# Patient Record
Sex: Male | Born: 1980 | Hispanic: No | Marital: Married | Smoking: Never smoker
Health system: Southern US, Community
[De-identification: ages and names within clinical notes are randomized; demographics above are authoritative.]

---

## 2004-11-25 ENCOUNTER — Encounter: Admission: RE | Admit: 2004-11-25 | Discharge: 2004-11-25 | Payer: Self-pay | Admitting: Specialist

## 2009-04-02 ENCOUNTER — Emergency Department (HOSPITAL_COMMUNITY): Admission: EM | Admit: 2009-04-02 | Discharge: 2009-04-02 | Payer: Self-pay | Admitting: Family Medicine

## 2009-05-17 ENCOUNTER — Encounter: Admission: RE | Admit: 2009-05-17 | Discharge: 2009-05-17 | Payer: Self-pay | Admitting: Neurology

## 2012-05-13 ENCOUNTER — Ambulatory Visit: Payer: BC Managed Care – PPO | Admitting: Family Medicine

## 2012-05-13 ENCOUNTER — Ambulatory Visit: Payer: BC Managed Care – PPO

## 2012-05-13 VITALS — BP 120/99 | HR 90 | Temp 98.4°F | Resp 18 | Ht 68.0 in | Wt 209.0 lb

## 2012-05-13 DIAGNOSIS — Z111 Encounter for screening for respiratory tuberculosis: Secondary | ICD-10-CM

## 2012-05-13 DIAGNOSIS — R590 Localized enlarged lymph nodes: Secondary | ICD-10-CM

## 2012-05-13 DIAGNOSIS — R599 Enlarged lymph nodes, unspecified: Secondary | ICD-10-CM

## 2012-05-13 DIAGNOSIS — J029 Acute pharyngitis, unspecified: Secondary | ICD-10-CM

## 2012-05-13 LAB — POCT CBC
Granulocyte percent: 39.8 %G (ref 37–80)
Hemoglobin: 13.5 g/dL — AB (ref 14.1–18.1)
Lymph, poc: 1.8 (ref 0.6–3.4)
MID (cbc): 0.3 (ref 0–0.9)
POC Granulocyte: 1.4 — AB (ref 2–6.9)
POC MID %: 9.2 %M (ref 0–12)

## 2012-05-13 LAB — POCT SEDIMENTATION RATE: POCT SED RATE: 26 mm/hr — AB (ref 0–22)

## 2012-05-13 NOTE — Progress Notes (Signed)
Subjective:    Patient ID: Albert Silva, male    DOB: August 01, 1981, 31 y.o.   MRN: [id number]  HPI   Pain in bilateral neck x 2 wks - was in [place] seeing family when it started - was treated w/ penicillin and lozenges - was about 2 wks ago. Then when he got back tot [place] tried mucinex.  Feels swollen knots in neck that alternate.  Has a mild pharyngitis.  No known sick contacts. 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   Review of Systems  Constitutional: Positive for chills and diaphoresis. Negative for fever, activity change and fatigue.  HENT: Positive for congestion, sore throat, neck pain, dental problem and postnasal drip. Negative for trouble swallowing, neck stiffness, voice change and sinus pressure.   Respiratory: Negative for cough, chest tightness and shortness of breath.   Cardiovascular: Negative for chest pain.  Skin: Negative for color change and rash.  Hematological: Positive for adenopathy. Does not bruise/bleed easily.  Psychiatric/Behavioral: Negative for disturbed wake/sleep cycle.       Objective:   Physical Exam  Vitals reviewed. Constitutional: He is oriented to person, place, and time. He appears well-developed and well-nourished. No distress.  HENT:  Head: Normocephalic and atraumatic.  Right Ear: External ear normal.  Left Ear: External ear normal.  Nose: Nose normal.  Mouth/Throat: Oropharynx is clear and moist. No oropharyngeal exudate.  Eyes: Conjunctivae normal are normal. No scleral icterus.  Neck: Normal range of motion. Neck supple. No thyromegaly present.  Cardiovascular: Normal rate, regular rhythm and normal heart sounds.   Pulmonary/Chest: Effort normal and breath sounds normal.  Musculoskeletal: He exhibits no edema.  Lymphadenopathy:       Head (right side): Submandibular adenopathy present. No submental, no preauricular, no posterior auricular and no occipital adenopathy present.       Head (left side): Submandibular  adenopathy present. No submental, no preauricular, no posterior auricular and no occipital adenopathy present.    He has cervical adenopathy.       Right cervical: Superficial cervical adenopathy present. No posterior cervical adenopathy present.      Left cervical: Superficial cervical adenopathy present. No posterior cervical adenopathy present.       Right: No supraclavicular adenopathy present.       Left: No supraclavicular adenopathy present.  Neurological: He is alert and oriented to person, place, and time.  Skin: Skin is warm and dry. No rash noted. He is not diaphoretic.  Psychiatric: He has a normal mood and affect. His behavior is normal.     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 Cervical lymphadenopathy - HIV, EBV, cbc, esr P.  If negative, consider toxo, cmv. CXR to screen for TB w/ h/o +ppd. Continue watchful waitin.

## 2012-05-14 LAB — EPSTEIN-BARR VIRUS VCA ANTIBODY PANEL
EBV NA IgG: >600 U/mL — ABNORMAL HIGH (ref <18.0)
EBV VCA IgM: <10 U/mL (ref <36.0)

## 2014-06-26 ENCOUNTER — Ambulatory Visit (INDEPENDENT_AMBULATORY_CARE_PROVIDER_SITE_OTHER): Payer: BC Managed Care – PPO | Admitting: Emergency Medicine

## 2014-06-26 VITALS — BP 118/84 | HR 65 | Temp 98.1°F | Resp 18 | Ht 68.0 in | Wt 194.4 lb

## 2014-06-26 DIAGNOSIS — N489 Disorder of penis, unspecified: Secondary | ICD-10-CM

## 2014-06-26 DIAGNOSIS — A630 Anogenital (venereal) warts: Secondary | ICD-10-CM

## 2014-06-26 DIAGNOSIS — N4889 Other specified disorders of penis: Secondary | ICD-10-CM

## 2014-06-26 MED ORDER — PODOFILOX 0.5 % EX GEL: Freq: Two times a day (BID) | CUTANEOUS | Status: DC

## 2014-06-26 NOTE — Progress Notes (Signed)
    MRN: [id number] DOB: 01/31/1981  Subjective:   Albert Silva is a 33 y.o. male presenting for 6 week history of lesion on right side of penis. Patient noticed it first in the shower, was itching, not bleeding, no vesicles, he tried cleaning the lesion but did not resolve afterward. States that it scabs over, peels the skin off, but just won't go away. He continues to try and clean the lesion thoroughly some times to the point that it bleeds a little. Patient has also tried to use hydrocortisone cream OTC, other OTC creams he cannot recall with no relief. Lesion is not painful, denies penile discharge, dysuria, fevers, n/v, diarrhea, abdominal pain, rashes on soles of hands and feet, mouth sores, lesions elsewhere, weight loss. Patient is married since 2005, monogamous relatioship, fathered 2 children with his wife, sexually active with wife, no barrier contraception. Denies that wife has any symptoms.   Denies smoking cigarettes, drug use or drinking alcohol. Denies any other aggravating or relieving factors, no other questions or concerns.   Prior to Admission medications   Medication Sig Start Date End Date Taking? Authorizing Provider  guaiFENesin (MUCINEX) 600 MG 12 hr tablet Take 1,200 mg by mouth 2 (two) times daily.    Historical Provider, MD    No Known Allergies   History reviewed. No pertinent past medical history.   History reviewed. No pertinent past surgical history.   ROS As in subjective.   Objective:   PHYSICAL EXAM BP 118/84 mmHg  Pulse 65  Temp(Src) 98.1 F (36.7 C) (Oral)  Resp 18  Ht 5\' 8"  (1.727 m)  Wt 194 lb 6.4 oz (88.179 kg)  BMI 29.57 kg/m2  SpO2 100%  Physical Exam  Constitutional: He is oriented to person, place, and time and well-developed, well-nourished, and in no distress. No distress.  HENT:  Mouth/Throat: Oropharynx is clear and moist. No oropharyngeal exudate.  Eyes: Conjunctivae are normal. Pupils are equal, round, and reactive to  light. Right eye exhibits no discharge. Left eye exhibits no discharge. No scleral icterus.  Cardiovascular: Normal rate, regular rhythm, normal heart sounds and intact distal pulses.  Exam reveals no gallop and no friction rub.   No murmur heard. Pulmonary/Chest: Effort normal and breath sounds normal. No respiratory distress. He has no wheezes.  Abdominal: Soft. Bowel sounds are normal. He exhibits no distension and no mass. There is no tenderness.  Genitourinary: Testes/scrotum normal. He exhibits no abnormal testicular mass, no testicular tenderness, no abnormal scrotal mass and no scrotal tenderness. Penis exhibits lesions (multiple small dry, scaly, crusty lesions on mid-lateral shaft extending toward base). Penis exhibits no edema. No discharge found.  Neurological: He is alert and oriented to person, place, and time.  Skin: Skin is warm and dry. He is not diaphoretic. No erythema.  Psychiatric: Affect normal.    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 Lesion of penis Rx Podofilox, labs pending, return to clinic if symptoms worsen, fail to resolve or as needed - HIV antibody - RPR - HSV(herpes simplex vrs) 1+2 ab-IgG   Wallis BambergMario Denzil Bristol, PA-C [facility] [phone] 06/26/2014 12:02 PM

## 2014-06-26 NOTE — Patient Instructions (Addendum)
Apply Podofilox cream twice a day for 3 days, then stop for four days. This is one cycle. Complete four cycles for a total of 4 weeks treatment. If symptoms have not resolved, let me know so we can see for reevaluation or try a different treatment.   Human Papillomavirus HPV stands for human papillomavirus. There are many different types of HPV. People of all ages and races can get an HPV infection. In females, some types of HPV can cause warts on the sex organs or anus. Some females never see any warts on the outside, yet still have the infection inside. The doctor will need to check the sex organs and do a Pap test to see there is an HPV infection. The only sign of infection may be a Pap test that is abnormal. A male who has HPV has a higher chance of getting cancer of the sex organs or anus. It is very rare, but some females can give their babies the HPV infection when the baby is being born. Some of these babies can grow warts on the voice box (vocal cords). Males with HPV have a higher chance of getting cancer of the penis or anus. A male may have HPV but not see any warts on his penis or anus. There is no test to find HPV in males, so even if warts are not seen, there may still be an infection. HOME CARE   Take medicines as told by your doctor.  Get needed Pap tests.  Keep follow-up exams.  Do not touch or scratch the warts.  Do not treat warts with medicines used for treating hand warts.  Tell your sex partner about your infection because he or she may also need treatment.  Do not have sex while you are getting treatment.  After treatment, use condoms during sex.  Use over-the-counter creams for itching as told by your doctor.  Use over-the-counter or prescription medicines for pain, discomfort or fever as told by your doctor.  Do not douche or use tampons during treatment of HPV. GET HELP RIGHT AWAY IF:  You have a temperature by mouth above 102 F (38.9 C), not controlled  by medicine. MAKE SURE YOU:   Understand these instructions.  Will watch your condition.  Will get help right away if you are not doing well or get worse. Document Released: 07/13/2008 Document Revised: 10/23/2011 Document Reviewed: 11/05/2013 [facility] Patient Information 2015 [place], [place]. This information is not intended to replace advice given to you by your health care provider. Make sure you discuss any questions you have with your health care provider.

## 2014-06-27 LAB — HIV ANTIBODY (ROUTINE TESTING W REFLEX): HIV 1&2 Ab, 4th Generation: NONREACTIVE

## 2014-06-27 LAB — RPR

## 2014-06-29 LAB — HSV(HERPES SIMPLEX VRS) I + II AB-IGG
HSV 1 GLYCOPROTEIN G AB, IGG: 8.21 IV — AB
HSV 2 Glycoprotein G Ab, IgG: 6.13 IV — ABNORMAL HIGH

## 2014-06-30 ENCOUNTER — Telehealth: Payer: Self-pay | Admitting: *Deleted

## 2014-06-30 NOTE — Telephone Encounter (Signed)
Pt called and I called the pharmacy they do not make condylox in the gel and that they only make it in a solution.  Can we change from the gel to the solution?  Pt number is [phone]

## 2014-06-30 NOTE — Telephone Encounter (Signed)
Pt would like to know lab results.  Please review.

## 2014-06-30 NOTE — Telephone Encounter (Signed)
Left message for patient requesting a call back to discuss plan and follow up.  Do not switch to solution. I will discuss two alternative treatments with the patient.   Wallis BambergMario Zebulen Simonis, PA-C [facility] [phone] 06/25/2014 9:53 AM

## 2014-07-01 ENCOUNTER — Other Ambulatory Visit: Payer: Self-pay | Admitting: Urgent Care

## 2014-07-01 ENCOUNTER — Telehealth: Payer: Self-pay

## 2014-07-01 DIAGNOSIS — L739 Follicular disorder, unspecified: Secondary | ICD-10-CM

## 2014-07-01 MED ORDER — MUPIROCIN 2 % EX OINT: 1.0000 "application " | TOPICAL_OINTMENT | Freq: Three times a day (TID) | CUTANEOUS | Status: AC

## 2014-07-01 MED ORDER — HYDROXYZINE HCL 25 MG PO TABS: 12.5000 mg | ORAL_TABLET | Freq: Every evening | ORAL | Status: DC | PRN

## 2014-07-01 NOTE — Telephone Encounter (Signed)
Pt calling again about his labs. Please review. Thanks

## 2014-07-01 NOTE — Progress Notes (Signed)
Pharmacy did not have Podofilox in formulation we ordered. Clinical suspicion is that this may be genital folliculitis. We will attempt a trial of Bactroban ointment and see you again in 7-10 days. If symptoms persist, we will attempt a trial of Imiquimoid for venereal warts.  Wallis BambergMario Giavonni Cizek, PA-C [facility] [phone] 07/01/2014 10:30 PM

## 2014-07-01 NOTE — Telephone Encounter (Signed)
Albert Silva-pt calling you back. Best number is [phone]

## 2014-07-01 NOTE — Telephone Encounter (Signed)
Patient is return

## 2014-07-01 NOTE — Telephone Encounter (Signed)
Patient is calling back to speak with Wallis Bambergmario mani

## 2014-07-01 NOTE — Telephone Encounter (Signed)
Please let the patient know labs were negative.  Follow up as needed

## 2014-07-02 NOTE — Telephone Encounter (Signed)
Discussed results, treatment and plan for follow up in 1 week. Patient agreed.  Albert BambergMario Nathyn Luiz, PA-C [facility] [phone] 07/02/2014  3:24 PM

## 2015-09-08 ENCOUNTER — Ambulatory Visit (INDEPENDENT_AMBULATORY_CARE_PROVIDER_SITE_OTHER): Payer: BLUE CROSS/BLUE SHIELD | Admitting: Emergency Medicine

## 2015-09-08 VITALS — BP 108/84 | HR 71 | Temp 98.4°F | Resp 18 | Ht 68.25 in | Wt 206.0 lb

## 2015-09-08 DIAGNOSIS — N418 Other inflammatory diseases of prostate: Secondary | ICD-10-CM

## 2015-09-08 DIAGNOSIS — R1030 Lower abdominal pain, unspecified: Secondary | ICD-10-CM

## 2015-09-08 DIAGNOSIS — R350 Frequency of micturition: Secondary | ICD-10-CM | POA: Diagnosis not present

## 2015-09-08 LAB — POCT CBC
GRANULOCYTE PERCENT: 39.8 % (ref 37–80)
HCT, POC: 42.1 % — AB (ref 43.5–53.7)
HEMOGLOBIN: 14.6 g/dL (ref 14.1–18.1)
Lymph, poc: 2.1 (ref 0.6–3.4)
MCH: 30.6 pg (ref 27–31.2)
MCHC: 34.6 g/dL (ref 31.8–35.4)
MCV: 88.3 fL (ref 80–97)
MID (CBC): 0.2 (ref 0–0.9)
MPV: 7.2 fL (ref 0–99.8)
PLATELET COUNT, POC: 214 10*3/uL (ref 142–424)
POC Granulocyte: 1.6 — AB (ref 2–6.9)
POC LYMPH PERCENT: 55 %L — AB (ref 10–50)
POC MID %: 5.2 % (ref 0–12)
RBC: 4.77 M/uL (ref 4.69–6.13)
RDW, POC: 12.8 %
WBC: 3.9 10*3/uL — AB (ref 4.6–10.2)

## 2015-09-08 LAB — POCT URINALYSIS DIP (MANUAL ENTRY)
BILIRUBIN UA: NEGATIVE
GLUCOSE UA: NEGATIVE
Ketones, POC UA: NEGATIVE
Leukocytes, UA: NEGATIVE
Nitrite, UA: NEGATIVE
PH UA: 7
Protein Ur, POC: NEGATIVE
RBC UA: NEGATIVE
SPEC GRAV UA: 1.01
Urobilinogen, UA: 0.2

## 2015-09-08 LAB — POC MICROSCOPIC URINALYSIS (UMFC): Mucus: ABSENT

## 2015-09-08 LAB — GLUCOSE, POCT (MANUAL RESULT ENTRY): POC GLUCOSE: 88 mg/dL (ref 70–99)

## 2015-09-08 MED ORDER — DOXYCYCLINE HYCLATE 100 MG PO TABS: 100.0000 mg | ORAL_TABLET | Freq: Two times a day (BID) | ORAL | Status: DC

## 2015-09-08 NOTE — Progress Notes (Signed)
Patient ID: Albert Silva, male   DOB: [birth year], 35 y.o.   MRN: [id number]    By signing my name below, I, Essence Howell, attest that this documentation has been prepared under the direction and in the presence of Collene Gobble, MD Electronically Signed: Charline Bills, ED Scribe 09/08/2015 at 1:37 PM.  Chief Complaint:  Chief Complaint  Patient presents with  . Urinary Frequency     had 10 days of sulfa - after 1 week the Sx return  . Groin Pain    mostly on the left side   HPI: Albert Silva is a 35 y.o. male who reports to [facility] today complaining of gradually worsening urinary frequency for the past week. Pt states that he gets up to use the restroom at least 3 times. He reports associated burning sensation in his left groin when he has the urge to urinate. No treatments tried PTA. Pt denies fever, chills, dysuria, straining or difficulty urinating. No h/o DM or HTN.  History reviewed. No pertinent past medical history. History reviewed. No pertinent past surgical history. Social History   Social History  . Marital Status: Married    Spouse Name: N/A  . Number of Children: N/A  . Years of Education: N/A   Social History Main Topics  . Smoking status: Never Smoker   . Smokeless tobacco: None  . Alcohol Use: None  . Drug Use: None  . Sexual Activity: Not Asked   Other Topics Concern  . None   Social History Narrative   Family History  Problem Relation Age of Onset  . Diabetes Father    No Known Allergies Prior to Admission medications   Not on File   ROS: The patient denies fevers, chills, night sweats, unintentional weight loss, chest pain, palpitations, wheezing, dyspnea on exertion, nausea, vomiting, abdominal pain, dysuria, difficulty urinating, hematuria, melena, numbness, weakness, or tingling. +urinary frequency, +groin pain   All other systems have been reviewed and were otherwise negative with the exception of those mentioned in the HPI and as above.     PHYSICAL EXAM: Filed Vitals:   09/08/15 1326  BP: 108/84  Pulse: 71  Temp: 98.4 F (36.9 C)  Resp: 18   Body mass index is 31.08 kg/(m^2).  General: Alert, no acute distress HEENT:  Normocephalic, atraumatic, oropharynx patent. Eye: Nonie Hoyer [facility] Cardiovascular:  Regular rate and rhythm, no rubs murmurs or gallops. No Carotid bruits, radial pulse intact. No pedal edema.  Respiratory: Clear to auscultation bilaterally. No wheezes, rales, or rhonchi. No cyanosis, no use of accessory musculature Abdominal: No organomegaly, abdomen is soft and non-tender, positive bowel sounds. No masses. GU: Normal. Testicles are nontender. Prostate is small and nontender. Musculoskeletal: Gait intact. No edema, tenderness Skin: No rashes. Neurologic: Facial musculature symmetric. Psychiatric: Patient acts appropriately throughout our interaction. Lymphatic: No cervical or submandibular lymphadenopathy  LABS: Results for orders placed or performed in visit on 09/08/15  POCT glucose (manual entry)  Result Value Ref Range   POC Glucose 88 70 - 99 mg/dl  POCT CBC  Result Value Ref Range   WBC 3.9 (A) 4.6 - 10.2 K/uL   Lymph, poc 2.1 0.6 - 3.4   POC LYMPH PERCENT 55.0 (A) 10 - 50 %L   MID (cbc) 0.2 0 - 0.9   POC MID % 5.2 0 - 12 %M   POC Granulocyte 1.6 (A) 2 - 6.9   Granulocyte percent 39.8 37 - 80 %G   RBC 4.77 4.69 - 6.13  M/uL   Hemoglobin 14.6 14.1 - 18.1 g/dL   HCT, POC 16.1 (A) 09.6 - 53.7 %   MCV 88.3 80 - 97 fL   MCH, POC 30.6 27 - 31.2 pg   MCHC 34.6 31.8 - 35.4 g/dL   RDW, POC 04.5 %   Platelet Count, POC 214 142 - 424 K/uL   MPV 7.2 0 - 99.8 fL  POCT urinalysis dipstick  Result Value Ref Range   Color, UA yellow yellow   Clarity, UA clear clear   Glucose, UA negative negative   Bilirubin, UA negative negative   Ketones, POC UA negative negative   Spec Grav, UA 1.010    Blood, UA negative negative   pH, UA 7.0    Protein Ur, POC negative negative   Urobilinogen, UA 0.2     Nitrite, UA Negative Negative   Leukocytes, UA Negative Negative  POCT Microscopic Urinalysis ([facility])  Result Value Ref Range   WBC,UR,HPF,POC None None WBC/hpf   RBC,UR,HPF,POC None None RBC/hpf   Bacteria None None, Too numerous to count   Mucus Absent Absent   Epithelial Cells, UR Per Microscopy None None, Too numerous to count cells/hpf   EKG/XRAY:   Primary read interpreted by Dr. Cleta Alberts at [facility].  ASSESSMENT/PLAN: Patient given trial of doxycycline to see if that helps. If he continues to have symptoms when advise urological evaluation. His urine and glucose are unremarkable. His white count is low no evidence of acute severe infection.I personally performed the services described in this documentation, which was scribed in my presence. The recorded information has been reviewed and is accurate.    Gross sideeffects, risk and benefits, and alternatives of medications d/w patient. Patient is aware that all medications have potential sideeffects and we are unable to predict every sideeffect or drug-drug interaction that may occur.  Lesle Chris MD 09/08/2015 1:29 PM

## 2015-09-09 LAB — PSA: PSA: 0.26 ng/mL (ref <=4.00)

## 2015-09-09 LAB — URINE CULTURE
COLONY COUNT: NO GROWTH
Organism ID, Bacteria: NO GROWTH

## 2018-12-16 ENCOUNTER — Emergency Department (HOSPITAL_COMMUNITY): Payer: BLUE CROSS/BLUE SHIELD

## 2018-12-16 ENCOUNTER — Emergency Department (HOSPITAL_COMMUNITY)
Admission: EM | Admit: 2018-12-16 | Discharge: 2018-12-16 | Disposition: A | Discharge status: Home / Self Care | Payer: BLUE CROSS/BLUE SHIELD | Attending: Emergency Medicine | Admitting: Emergency Medicine

## 2018-12-16 ENCOUNTER — Encounter (HOSPITAL_COMMUNITY): Payer: Self-pay | Admitting: Physician Assistant

## 2018-12-16 ENCOUNTER — Other Ambulatory Visit: Payer: Self-pay

## 2018-12-16 DIAGNOSIS — R509 Fever, unspecified: Secondary | ICD-10-CM | POA: Diagnosis present

## 2018-12-16 DIAGNOSIS — R208 Other disturbances of skin sensation: Secondary | ICD-10-CM | POA: Insufficient documentation

## 2018-12-16 DIAGNOSIS — Z79899 Other long term (current) drug therapy: Secondary | ICD-10-CM | POA: Insufficient documentation

## 2018-12-16 DIAGNOSIS — R6889 Other general symptoms and signs: Secondary | ICD-10-CM

## 2018-12-16 NOTE — ED Triage Notes (Signed)
Pt c/o fever and chills x few days. Denies any sob, has occasional cough. Oral temp 34F

## 2018-12-16 NOTE — ED Provider Notes (Signed)
[facility] EMERGENCY DEPARTMENT Provider Note   CSN: [id number] Arrival date & time: 12/16/18  4462    History   Chief Complaint Chief Complaint  Patient presents with  . Chills  . Fever    HPI Albert Silva is a 38 y.o. male with no significant past medical history who presents today for evaluation of subjective fever and chills.  He reports that the symptoms have been present since yesterday.  He endorsed cough according to triage note, however on my discussions with him he denies any cough, chest pain, shortness of breath, abdominal pain, urinary symptoms, nausea, vomiting, diarrhea, or headache.  He does not have a sore throat or nasal congestion.  He denies any rashes or wounds.  He reports that he used an infrared forehead thermometer to check his temperature and that it was 96 Fahrenheit.  He had Tylenol last at 2 AM.  No Known sick exposures, no recent travel.   He tells me that the air coming out of his nose feels hot.       HPI  History reviewed. No pertinent past medical history.  There are no active problems to display for this patient.   History reviewed. No pertinent surgical history.      Home Medications    Prior to Admission medications   Medication Sig Start Date End Date Taking? Authorizing Provider  doxycycline (VIBRA-TABS) 100 MG tablet Take 1 tablet (100 mg total) by mouth 2 (two) times daily. 09/08/15   Collene Gobble, MD    Family History Family History  Problem Relation Age of Onset  . Diabetes Father     Social History Social History   Tobacco Use  . Smoking status: Never Smoker  Substance Use Topics  . Alcohol use: Not on file  . Drug use: Not on file     Allergies   Patient has no known allergies.   Review of Systems Review of Systems  Constitutional: Positive for chills and fever.  HENT: Negative for congestion, postnasal drip, rhinorrhea, sinus pressure, sore throat, trouble swallowing and voice change.    Eyes: Negative for visual disturbance.  Respiratory: Negative for cough, chest tightness and shortness of breath.   Cardiovascular: Negative for chest pain.  Gastrointestinal: Negative for abdominal pain, constipation, diarrhea, nausea and vomiting.  Genitourinary: Negative for dysuria, frequency and urgency.  Musculoskeletal: Negative for arthralgias, back pain and neck pain.  Skin: Negative for rash and wound.  Allergic/Immunologic: Negative for immunocompromised state.  Neurological: Negative for weakness, light-headedness, numbness and headaches.  Psychiatric/Behavioral: Negative for confusion.  All other systems reviewed and are negative.    Physical Exam Updated Vital Signs BP (!) 130/91 (BP Location: Right Arm)   Pulse 84   Temp 99.4 F (37.4 C) (Oral)   Resp 17   Ht 5\' 6"  (1.676 m)   Wt 95.3 kg   SpO2 99%   BMI 33.89 kg/m   Physical Exam Vitals signs and nursing note reviewed.  Constitutional:      General: He is not in acute distress.    Appearance: He is well-developed. He is not ill-appearing or diaphoretic.  HENT:     Head: Normocephalic and atraumatic.  Eyes:     General: No scleral icterus.       Right eye: No discharge.        Left eye: No discharge.     Conjunctiva/sclera: Conjunctivae normal.  Neck:     Musculoskeletal: Normal range of motion.  Cardiovascular:  Rate and Rhythm: Normal rate and regular rhythm.     Pulses: Normal pulses.     Heart sounds: Normal heart sounds.  Pulmonary:     Effort: Pulmonary effort is normal. No respiratory distress.     Breath sounds: Normal breath sounds. No stridor.  Abdominal:     General: Abdomen is flat. There is no distension.     Palpations: Abdomen is soft.     Tenderness: There is no abdominal tenderness.  Musculoskeletal:        General: No deformity.  Skin:    General: Skin is warm and dry.  Neurological:     General: No focal deficit present.     Mental Status: He is alert and oriented to  person, place, and time.     Motor: No abnormal muscle tone.  Psychiatric:        Mood and Affect: Mood normal.        Behavior: Behavior normal.      ED Treatments / Results  Labs (all labs ordered are listed, but only abnormal results are displayed) Labs Reviewed - No data to display  EKG None  Radiology D[facility]  Result Date: 12/16/2018 CLINICAL DATA:  Fever, cough, and chills. EXAM: PORTABLE CHEST 1 VIEW COMPARISON:  05/13/2012 FINDINGS: The heart size and mediastinal contours are within normal limits. Both lungs are clear. The visualized skeletal structures are unremarkable. IMPRESSION: Normal exam. Electronically Signed   By: Francene BoyersJames  Maxwell M.D.   On: 12/16/2018 09:17    Procedures Procedures (including critical care time)  Medications Ordered in ED Medications - No data to display   Initial Impression / Assessment and Plan / ED Course  I have reviewed the triage vital signs and the nursing notes.  Pertinent labs & imaging results that were available during my care of the patient were reviewed by me and considered in my medical decision making (see chart for details).  Clinical Course as of Dec 15 924  Mon Dec 16, 2018  0920 Negative  DG Chest OnoPort 1 View [EH]    Clinical Course User Index [EH] Cristina GongHammond, Elizabeth W, [place]PA-C      Patient presents today for evaluation of subjective fevers and chills with no other reported associated symptoms.  He does not have any known sick contacts.  He has not had any antipyretics in the past 7 hours prior to arrival.  The only time he checked his temperature at home was with an infrared forehead thermometer and his temperature was 96 degrees.  He denies any localizing symptoms, and remainder of ROS is negative.  Given current pandemic chest x-ray was obtained which did not show any evidence of acute abnormality.  At this time he does not meet indications for coronavirus testing. Recommended OTC antipyretics.  As he does  not have any symptoms other than subjective fever, and is actually afebrile here, no additional work-up indicated.   Albert Silva was evaluated in Emergency Department on 12/16/2018 for the symptoms described in the history of present illness. He was evaluated in the context of the global COVID-19 pandemic, which necessitated consideration that the patient might be at risk for infection with the SARS-CoV-2 virus that causes COVID-19. Institutional protocols and algorithms that pertain to the evaluation of patients at risk for COVID-19 are in a state of rapid change based on information released by regulatory bodies including the CDC and federal and state organizations. These policies and algorithms were followed during the patient's  care in the ED.   Return precautions were discussed with patient who states their understanding.  At the time of discharge patient denied any unaddressed complaints or concerns.  Patient is agreeable for discharge home.    Final Clinical Impressions(s) / ED Diagnoses   Final diagnoses:  Sensation of feeling hot    ED Discharge Orders    None       Norman Clay 12/16/18 1610    Tegeler, Canary Brim, MD 12/16/18 1626

## 2018-12-16 NOTE — Discharge Instructions (Addendum)
Today your chest x-ray was normal. I have given you instructions on the maximum safe dosing of ibuprofen and Tylenol.  You do not have a fever here today.  Please get a thermometer that you can use to check your temperature under your tongue from the store.  If you develop fever with symptoms please follow-up with your primary care doctor.  There is a possibility that you may have coronavirus, as we are in the middle of a global pandemic.  Therefore I recommend that you stay home, quarantine, for a minimum of 7 days.  After that you may return to normal activities when you have been fever free for 3 days.  Please schedule a follow-up appointment with your primary care doctor.  If your symptoms worsen, you become significantly short of breath with severe cough or have other concerns please seek additional medical care and evaluation.  Please take Ibuprofen (Advil, motrin) and Tylenol (acetaminophen) to relieve your pain.  You may take up to 600 MG (3 pills) of normal strength ibuprofen every 8 hours as needed.  In between doses of ibuprofen you make take tylenol, up to 1,000 mg (two extra strength pills).  Do not take more than 3,000 mg tylenol in a 24 hour period.  Please check all medication labels as many medications such as pain and cold medications may contain tylenol.  Do not drink alcohol while taking these medications.  Do not take other NSAID'S while taking ibuprofen (such as aleve or naproxen).  Please take ibuprofen with food to decrease stomach upset.

## 2018-12-16 NOTE — ED Triage Notes (Signed)
Pt arrives from home complaining of fever and chills that began yesterday. Pt reports taking tylenol last night with no relief.

## 2019-11-29 IMAGING — DX PORTABLE CHEST - 1 VIEW
1 series · 1 of 1 positions shown · non-contrast
Comparison: 05/13/2012

CLINICAL DATA: Fever, cough, and chills.

EXAM:
PORTABLE CHEST 1 VIEW

[chest ap]
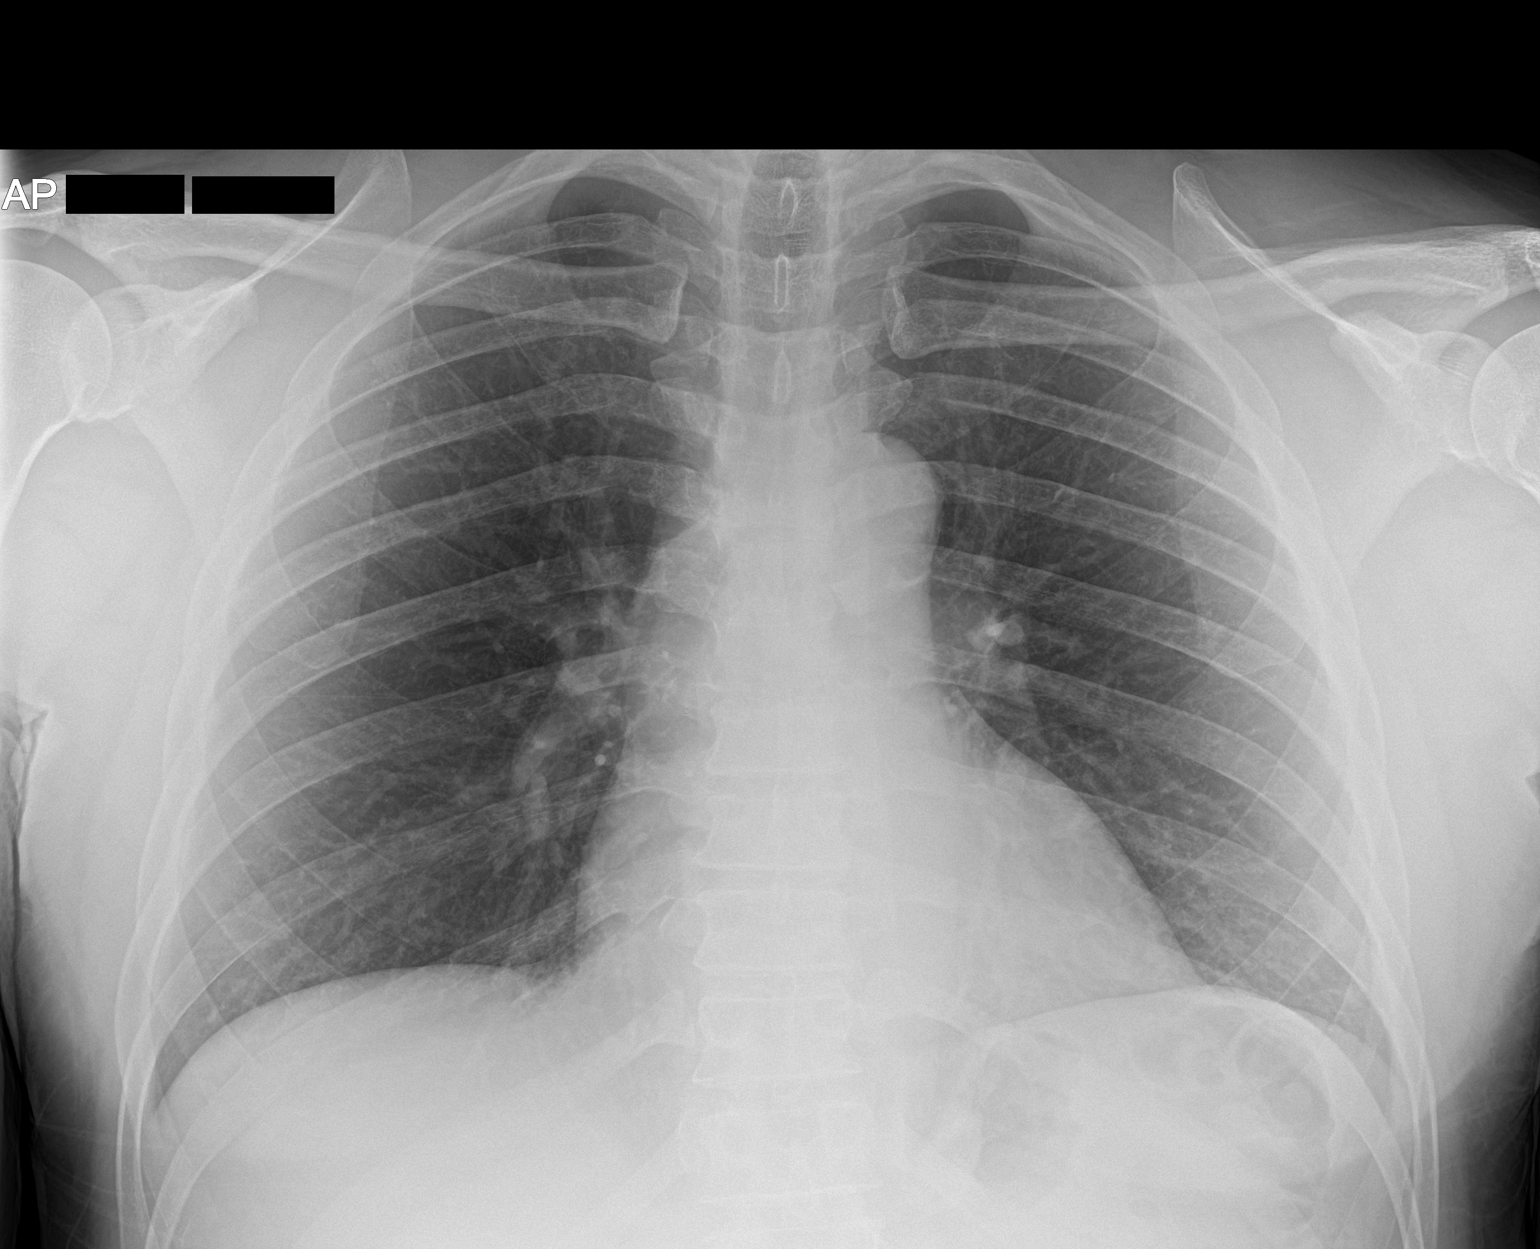

[1 of 1 positions shown; findings below may reference images not displayed]

FINDINGS: The heart size and mediastinal contours are within normal limits.
Both lungs are clear. The visualized skeletal structures are
unremarkable.
IMPRESSION: Normal exam.

## 2021-04-07 ENCOUNTER — Other Ambulatory Visit: Payer: Self-pay | Admitting: Physician Assistant

## 2021-04-07 DIAGNOSIS — R221 Localized swelling, mass and lump, neck: Secondary | ICD-10-CM

## 2021-04-28 ENCOUNTER — Inpatient Hospital Stay: Admission: RE | Admit: 2021-04-28 | Payer: BLUE CROSS/BLUE SHIELD | Source: Ambulatory Visit

## 2021-08-08 ENCOUNTER — Other Ambulatory Visit: Payer: Self-pay

## 2021-08-08 ENCOUNTER — Ambulatory Visit (HOSPITAL_COMMUNITY)
Admission: EM | Admit: 2021-08-08 | Discharge: 2021-08-08 | Disposition: A | Discharge status: Home / Self Care | Payer: BC Managed Care – PPO | Attending: Student | Admitting: Student

## 2021-08-08 ENCOUNTER — Encounter (HOSPITAL_COMMUNITY): Payer: Self-pay | Admitting: Emergency Medicine

## 2021-08-08 DIAGNOSIS — S161XXA Strain of muscle, fascia and tendon at neck level, initial encounter: Secondary | ICD-10-CM | POA: Diagnosis not present

## 2021-08-08 MED ORDER — TIZANIDINE HCL 2 MG PO TABS: 2.0000 mg | ORAL_TABLET | Freq: Three times a day (TID) | ORAL | 0 refills | Status: DC | PRN
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
## 2021-08-08 NOTE — Discharge Instructions (Addendum)
-  Start the muscle relaxer-Zanaflex (tizanidine), up to 3 times daily for muscle spasms and pain.  This can make you drowsy, so take at bedtime or when you do not need to drive or operate machinery. -You can take Tylenol up to 1000 mg 3 times daily, and ibuprofen up to 600 mg 3 times daily with food.  You can take these together, or alternate every 3-4 hours. -Heating pad or ice- you can use one or alternate them. -Head to the ED if new symptoms - new pain going down your arms, the worst headache of your life, vision changes, unusual drowsiness, unusual weakness, abdominal pain.
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
## 2022-04-06 ENCOUNTER — Ambulatory Visit (HOSPITAL_COMMUNITY)
Admission: EM | Admit: 2022-04-06 | Discharge: 2022-04-06 | Disposition: A | Discharge status: Home / Self Care | Payer: BC Managed Care – PPO | Attending: Internal Medicine | Admitting: Internal Medicine

## 2022-04-06 ENCOUNTER — Encounter (HOSPITAL_COMMUNITY): Payer: Self-pay | Admitting: Emergency Medicine

## 2022-04-06 DIAGNOSIS — R519 Headache, unspecified: Secondary | ICD-10-CM

## 2022-04-06 MED ORDER — KETOROLAC TROMETHAMINE 30 MG/ML IJ SOLN
INTRAMUSCULAR | Status: AC
  Filled 2022-04-06: qty 1

## 2022-04-06 MED ORDER — IBUPROFEN 600 MG PO TABS: 600.0000 mg | ORAL_TABLET | Freq: Four times a day (QID) | ORAL | 0 refills | Status: DC | PRN

## 2022-04-06 MED ORDER — ACETAMINOPHEN 325 MG PO TABS
ORAL_TABLET | ORAL | Status: AC
  Filled 2022-04-06: qty 3

## 2022-04-06 MED ORDER — ACETAMINOPHEN 500 MG PO TABS: 1000.0000 mg | ORAL_TABLET | Freq: Four times a day (QID) | ORAL | 0 refills | Status: AC | PRN

## 2022-04-06 MED ORDER — KETOROLAC TROMETHAMINE 30 MG/ML IJ SOLN
30.0000 mg | Freq: Once | INTRAMUSCULAR | Status: AC
  Administered 2022-04-06: 30 mg via INTRAMUSCULAR

## 2022-04-06 MED ORDER — ACETAMINOPHEN 325 MG PO TABS
975.0000 mg | ORAL_TABLET | Freq: Once | ORAL | Status: AC
  Administered 2022-04-06: 975 mg via ORAL
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
## 2022-04-06 NOTE — Discharge Instructions (Addendum)
You have been evaluated today for headache. Your evaluation did not show evidence of medical conditions requiring emergent intervention at this time.  You were given a strong NSAID in the clinic today, so do not  take ibuprofen or other NSAIDS (Aleve, aspirin, naproxen, ibuprofen, goody powder, etc.) for the next 8-10 hours until tonight at 6-8pm if needed.  Take 600mg  ibuprofen every 6 hours or tylenol 1,000 every 6 hours as needed for pain. If needed, you can alternate these medications so that you take one medication every 3 hours. For instance, at noon take ibuprofen, then at 3pm take tylenol, then at 6pm take ibuprofen.  Please seek emergency medical care if you experience worsening or uncontrolled pain, vision changes, recurrent vomiting, difficulty with normal activities, abnormal behavior, difficulty walking, numbness, weakness, or any other concerning symptoms.   I hope you feel better!   To find a primary care provider go to and follow the prompts to schedule a new patient appointment for primary care.  Someone from [facility] health will be reaching out to you either via MyChart or phone call to help you set up a primary care provider appointment as well.  It is very important to have a primary care provider to manage your overall health and prevent/manage chronic medical conditions.

## 2022-04-06 NOTE — ED Triage Notes (Signed)
Patient c/o RT sided headache x 2 weeks.   Patient denies N/V.   Patient denies fall or trauma.   Patient endorses RT sided neck pain that started upon onset of symptoms.   Patient endorses new onset of fatigue while working.   Patient hasn't taken any medications for symptoms.   Patient denies history of headaches.

## 2022-04-08 ENCOUNTER — Emergency Department (HOSPITAL_COMMUNITY): Payer: BC Managed Care – PPO

## 2022-04-08 ENCOUNTER — Emergency Department (HOSPITAL_COMMUNITY)
Admission: EM | Admit: 2022-04-08 | Discharge: 2022-04-08 | Disposition: A | Discharge status: Home / Self Care | Payer: BC Managed Care – PPO | Attending: Emergency Medicine | Admitting: Emergency Medicine

## 2022-04-08 ENCOUNTER — Encounter (HOSPITAL_COMMUNITY): Payer: Self-pay

## 2022-04-08 DIAGNOSIS — R519 Headache, unspecified: Secondary | ICD-10-CM | POA: Insufficient documentation

## 2022-04-08 DIAGNOSIS — M542 Cervicalgia: Secondary | ICD-10-CM | POA: Insufficient documentation

## 2022-04-08 LAB — CBC WITH DIFFERENTIAL/PLATELET
Abs Immature Granulocytes: 0.01 10*3/uL (ref 0.00–0.07)
Basophils Absolute: 0 10*3/uL (ref 0.0–0.1)
Basophils Relative: 0 %
Eosinophils Absolute: 0 10*3/uL (ref 0.0–0.5)
Eosinophils Relative: 1 %
HCT: 45.7 % (ref 39.0–52.0)
Hemoglobin: 15 g/dL (ref 13.0–17.0)
Immature Granulocytes: 0 %
Lymphocytes Relative: 44 %
Lymphs Abs: 1.6 10*3/uL (ref 0.7–4.0)
MCH: 30.2 pg (ref 26.0–34.0)
MCHC: 32.8 g/dL (ref 30.0–36.0)
MCV: 92.1 fL (ref 80.0–100.0)
Monocytes Absolute: 0.3 10*3/uL (ref 0.1–1.0)
Monocytes Relative: 9 %
Neutro Abs: 1.7 10*3/uL (ref 1.7–7.7)
Neutrophils Relative %: 46 %
Platelets: 212 10*3/uL (ref 150–400)
RBC: 4.96 MIL/uL (ref 4.22–5.81)
RDW: 12.3 % (ref 11.5–15.5)
WBC: 3.6 10*3/uL — ABNORMAL LOW (ref 4.0–10.5)
nRBC: 0 % (ref 0.0–0.2)

## 2022-04-08 LAB — BASIC METABOLIC PANEL
Anion gap: 7 (ref 5–15)
BUN: 24 mg/dL — ABNORMAL HIGH (ref 6–20)
CO2: 27 mmol/L (ref 22–32)
Calcium: 9.9 mg/dL (ref 8.9–10.3)
Chloride: 107 mmol/L (ref 98–111)
Creatinine, Ser: 1.12 mg/dL (ref 0.61–1.24)
GFR, Estimated: >60 mL/min (ref >60)
Glucose, Bld: 99 mg/dL (ref 70–99)
Potassium: 4.4 mmol/L (ref 3.5–5.1)
Sodium: 141 mmol/L (ref 135–145)

## 2022-04-08 MED ORDER — KETOROLAC TROMETHAMINE 15 MG/ML IJ SOLN
15.0000 mg | Freq: Once | INTRAMUSCULAR | Status: AC
  Administered 2022-04-08: 15 mg via INTRAVENOUS
  Filled 2022-04-08: qty 1

## 2022-04-08 MED ORDER — PROCHLORPERAZINE EDISYLATE 10 MG/2ML IJ SOLN
10.0000 mg | Freq: Once | INTRAMUSCULAR | Status: AC
  Administered 2022-04-08: 10 mg via INTRAVENOUS
  Filled 2022-04-08: qty 2

## 2022-04-08 NOTE — ED Provider Notes (Signed)
[facility]-EMERGENCY DEPT Provider Note   CSN: [id number] Arrival date & time: 04/08/22  0757     History  Chief Complaint  Patient presents with   Headache   Neck Pain    Albert Silva is a 41 y.o. male with no known past medical history presenting today with headache for the week.  Says is mostly posterior and radiates around the right side of his face.  No facial droop, word slurring, confusion, extremity weakness.  No photophobia or visual disturbance either.  Says that he was seen at urgent care and they gave him ibuprofen which relieved his symptoms.  He was told if he continues to have headaches to come to the emergency department for imaging.   Headache Associated symptoms: neck pain   Associated symptoms: no dizziness, no eye pain, no fever, no neck stiffness, no photophobia and no weakness   Neck Pain Associated symptoms: headaches   Associated symptoms: no chest pain, no fever, no photophobia and no weakness        Home Medications Prior to Admission medications   Medication Sig Start Date End Date Taking? Authorizing Provider  acetaminophen (TYLENOL) 500 MG tablet Take 2 tablets (1,000 mg total) by mouth every 6 (six) hours as needed. 04/06/22   Carlisle Beers, FNP  ibuprofen (ADVIL) 600 MG tablet Take 1 tablet (600 mg total) by mouth every 6 (six) hours as needed. 04/06/22   Carlisle Beers, FNP  tiZANidine (ZANAFLEX) 2 MG tablet Take 1 tablet (2 mg total) by mouth every 8 (eight) hours as needed for muscle spasms. 08/08/21   Rhys Martini, PA-C      Allergies    Patient has no known allergies.    Review of Systems   Review of Systems  Constitutional:  Negative for chills and fever.  Eyes:  Negative for photophobia, pain and visual disturbance.  Respiratory:  Negative for shortness of breath.   Cardiovascular:  Negative for chest pain.  Musculoskeletal:  Positive for neck pain. Negative for neck stiffness.  Neurological:   Positive for headaches. Negative for dizziness, syncope, weakness and light-headedness.    Physical Exam Updated Vital Signs BP (!) 118/95   Pulse 81   Temp 98.2 F (36.8 C) (Oral)   Resp 16   Ht 5\' 6"  (1.676 m)   Wt 97.1 kg   SpO2 96%   BMI 34.54 kg/m  Physical Exam Vitals and nursing note reviewed.  Constitutional:      General: He is not in acute distress.    Appearance: Normal appearance. He is not ill-appearing.  HENT:     Head: Normocephalic and atraumatic.  Eyes:     General: No scleral icterus.    Extraocular Movements: Extraocular movements intact.     Conjunctiva/sclera: Conjunctivae normal.     Pupils: Pupils are equal, round, and reactive to light.  Pulmonary:     Effort: Pulmonary effort is normal. No respiratory distress.  Skin:    General: Skin is warm and dry.     Findings: No rash.  Neurological:     Mental Status: He is alert.     GCS: GCS eye subscore is 4. GCS verbal subscore is 5. GCS motor subscore is 6.     Cranial Nerves: No cranial nerve deficit or dysarthria.     Comments: Cranial nerves II through XII grossly intact.  Normal range of motion and strength of the bilateral upper and lower extremities.  No problem with finger-nose.  EOMs intact and PERRLA.  No facial droop or neglect  Psychiatric:        Mood and Affect: Mood normal.        Behavior: Behavior normal.     ED Results / Procedures / Treatments   Labs (all labs ordered are listed, but only abnormal results are displayed) Labs Reviewed  BASIC METABOLIC PANEL - Abnormal; Notable for the following components:      Result Value   BUN 24 (*)    All other components within normal limits  CBC WITH DIFFERENTIAL/PLATELET - Abnormal; Notable for the following components:   WBC 3.6 (*)    All other components within normal limits    EKG None  Radiology CT Head Wo Contrast  Result Date: 04/08/2022 CLINICAL DATA:  Headache. Acute neurologic deficit. Suspected stroke. EXAM: CT HEAD  WITHOUT CONTRAST TECHNIQUE: Contiguous axial images were obtained from the base of the skull through the vertex without intravenous contrast. RADIATION DOSE REDUCTION: This exam was performed according to the departmental dose-optimization program which includes automated exposure control, adjustment of the mA and/or kV according to patient size and/or use of iterative reconstruction technique. COMPARISON:  None Available. FINDINGS: Brain: No evidence of intracranial hemorrhage, acute infarction, hydrocephalus, extra-axial collection, or mass lesion/mass effect. Vascular:  No hyperdense vessel or other acute findings. Skull: No evidence of fracture or other significant bone abnormality. Sinuses/Orbits:  No acute findings. Other: None. IMPRESSION: Negative noncontrast head CT. Electronically Signed   By: Danae Orleans M.D.   On: 04/08/2022 09:58    Procedures Procedures   Medications Ordered in ED Medications  ketorolac (TORADOL) 15 MG/ML injection 15 mg (has no administration in time range)  prochlorperazine (COMPAZINE) injection 10 mg (has no administration in time range)    ED Course/ Medical Decision Making/ A&P                           Medical Decision Making Amount and/or Complexity of Data Reviewed Labs: ordered. Radiology: ordered.  Risk Prescription drug management.   This patient presents to the ED for concern of h/a. Emergent considerations for headache include subarachnoid hemorrhage, meningitis, temporal arteritis, glaucoma, cerebral ischemia, carotid/vertebral dissection, intracranial tumor, Venous sinus thrombosis, carbon monoxide poisoning, acute or chronic subdural hemorrhage.  This is not an exhaustive differential.    Past Medical History / Co-morbidities / Social History: N/A   Additional history: Reviewed patient's urgent care note from 2 days ago.  Tylenol and ibuprofen sent to his pharmacy, both of which she said are helpful   Physical Exam: Pertinent physical  exam findings include Normal  Lab Tests: I ordered, and personally interpreted labs.  The pertinent results include: basic labs within normal limits   Imaging Studies: I ordered and independently visualized and interpreted head CT which showed no abnormalities    Medications: I ordered medication including Toradol and Compazine. Reevaluation of the patient after these medicines showed that the patient resolved. I have reviewed the patients home medicines and have made adjustments as needed.  Disposition: This is a 24-year-old male presenting with a headache for the past few days.  Normal neurologic exam.  CT scan negative.  He says he feels better and is ready to go home.  I believe discharge is reasonable, he does not appear to have any urgent or emergent illness needing intervention today.  Reports that he has a PCP that is trying to set up an appointment with him and he  will call them back on Monday.  Discharged with return precautions.  Will continue NSAIDs and Tylenol.  Final Clinical Impression(s) / ED Diagnoses Final diagnoses:  Bad headache    Rx / DC Orders ED Discharge Orders     None      Results and diagnoses were explained to the patient. Return precautions discussed in full. Patient had no additional questions and expressed complete understanding.   This chart was dictated using voice recognition software.  Despite best efforts to proofread,  errors can occur which can change the documentation meaning.    Saddie Benders, PA-C 04/08/22 1542    Phoebe Sharps, DO 04/08/22 1552

## 2022-04-08 NOTE — Discharge Instructions (Signed)
Please make sure to call back to primary care doctor to call to this week to make an appointment.  You should discuss your headaches at that time.  Keep taking ibuprofen since it has helped you.  You may alternate this with Tylenol as well.  Your CT scan is normal your blood work is within normal limits today.  Please return with any worsening or recurring symptoms however otherwise be sure to see an outpatient physician.  It was a pleasure to meet you and I hope that you feel better!

## 2022-04-08 NOTE — ED Provider Triage Note (Signed)
Emergency Medicine Provider Triage Evaluation Note  Albert Silva , a 41 y.o. male  was evaluated in triage.  Pt complains of headache.  Has been going on for couple days.  Posterior and somewhat radiates around to the right temple.  Seen by urgent care who told him if it did not get better to come here.  Review of Systems  Positive: Headache Negative: Photophobia, phonophobia, blurred vision, facial droop or confusion  Physical Exam  BP 122/88 (BP Location: Left Arm)   Pulse 93   Temp 98.1 F (36.7 C) (Oral)   Resp 16   Ht 5\' 6"  (1.676 m)   Wt 97.1 kg   SpO2 100%   BMI 34.54 kg/m  Gen:   Awake, no distress   Resp:  Normal effort  MSK:   Moves extremities without difficulty  Other:    Medical Decision Making  Medically screening exam initiated at 9:42 AM.  Appropriate orders placed.  Albert Silva was informed that the remainder of the evaluation will be completed by another provider, this initial triage assessment does not replace that evaluation, and the importance of remaining in the ED until their evaluation is complete.  Basic labs and head CT ordered   Vickki Muff, PA-C 04/08/22 04/10/22

## 2022-04-08 NOTE — ED Triage Notes (Signed)
Pt presents with c/o headache and neck pain for several days. Pt seen at [facility] for same 2 days ago and told to come to the ER if the pain does not get any better.

## 2022-04-08 NOTE — ED Notes (Signed)
MD states all your blood work is normal and all you ct scans are normal Patient given discharge instructions, all questions answered. Patient in possession of all belongings, directed to the discharge area.

## 2023-07-26 DIAGNOSIS — M5416 Radiculopathy, lumbar region: Secondary | ICD-10-CM | POA: Diagnosis not present

## 2023-07-26 NOTE — Progress Notes (Signed)
 Lower back and lower abdominal pain for 2 weeks

## 2023-07-26 NOTE — Progress Notes (Signed)
 Subjective Patient ID: Albert Silva is a 42 y.o. male.  Patient presents with concerns of low back pain for the past couple weeks. He states it started on his left low back but now his right bothers him as well. He states the back pain is pretty constant and worsens when he bends over or with prolonged walking. The patient states he also has some discomfort that feels like it wraps around to his lower abdomen, and this seems to come and go. The patient denies any urinary symptoms including dysuria, frequency, urgency, hematuria, or discharge. He states the pain is more dull and achy and not sharp. He denies pain radiating into his legs or numbness/tingling. He denies nausea/vomiting, diarrhea, or fever. He has not taken or tried anything for his symptoms. He denies prior similar or any known injury or precipitating event/activity.    History provided by:  Patient Back Pain Associated symptoms include abdominal pain. Pertinent negatives include no dysuria, fever, headaches, numbness or weakness.    Review of Systems  Constitutional:  Negative for fatigue and fever.  Gastrointestinal:  Positive for abdominal pain. Negative for constipation, diarrhea, nausea and vomiting.  Genitourinary:  Negative for difficulty urinating, dysuria, frequency, hematuria, penile discharge, testicular pain and urgency.  Musculoskeletal:  Positive for back pain. Negative for gait problem and myalgias.  Skin:  Negative for rash.  Neurological:  Negative for dizziness, weakness, numbness and headaches.    Patient History  Allergies: No Known Allergies   History reviewed. No pertinent past medical history. History reviewed. No pertinent surgical history. Social History   Socioeconomic History  . Marital status: Married    Spouse name: Not on file  . Number of children: Not on file  . Years of education: Not on file  . Highest education level: Not on file  Occupational History  . Not on file  Tobacco Use   . Smoking status: Never  . Smokeless tobacco: Never  Substance and Sexual Activity  . Alcohol use: Not on file  . Drug use: Not on file  . Sexual activity: Not on file  Other Topics Concern  . Not on file  Social History Narrative  . Not on file   History reviewed. No pertinent family history. No current outpatient medications on file prior to visit.   No current facility-administered medications on file prior to visit.     Objective  Vitals:   07/26/23 1003  BP: 118/81  BP Location: Left arm  Pulse: 79  Resp: 18  Temp: 36.9 C (98.4 F)  TempSrc: Oral  SpO2: 100%  Weight: 96.2 kg  Height: 5' 6  PainSc: 0-No pain                No results found.  Physical Exam Vitals and nursing note reviewed.  Constitutional:      General: He is not in acute distress. HENT:     Head: Normocephalic.  Cardiovascular:     Rate and Rhythm: Normal rate and regular rhythm.     Heart sounds: Normal heart sounds.  Pulmonary:     Effort: Pulmonary effort is normal.     Breath sounds: Normal breath sounds.  Abdominal:     General: Bowel sounds are normal.     Palpations: Abdomen is soft. There is no mass.     Tenderness: There is no abdominal tenderness. There is no right CVA tenderness, left CVA tenderness, guarding or rebound.     Hernia: No hernia is present.  Musculoskeletal:  Lumbar back: No tenderness. Normal range of motion. Negative right straight leg raise test and negative left straight leg raise test.     Comments: Points to bilateral lower lumbar as location of discomfort but no notable tenderness.  Skin:    Findings: No rash.  Neurological:     Mental Status: He is alert.     Gait: Gait normal.  Psychiatric:        Mood and Affect: Mood normal.      Results for orders placed or performed in visit on 07/26/23  POCT urinalysis dipstick manually resulted  Component Result   Color, UA Yellow   Clarity, UA Clear   Glucose, UA Negative   Bilirubin, UA  Negative   Ketones, UA Negative   Spec Grav, UA 1.015   Blood, UA Negative   pH, UA 7.0   Protein, UA Negative   Urobilinogen, UA Negative   Leukocytes, UA Negative   Nitrite, UA Negative     Procedures MDM:     1 Acute, uncomplicated illness or injury     Explanation of Medical Decision Making and variances from expected care:  GU cause less likely given lack of other GU sx, constant dull back pain is lower than typical renal discomfort and not consistent with more paroxysmal pain of nephrolithiasis. UA also negative. Sx most likely MSK/radicular and will trial steroid and mm relaxer. Discussed return and ER precautions.    Unique ordered tests: One     Risk:: Moderate          Assessment/Plan Diagnoses and all orders for this visit:  Lumbar radiculopathy -     POCT urinalysis dipstick manually resulted -     Baclofen 5 MG tablet; Take 2.5-5 mg by mouth 3 (three) times a day if needed (for spasms) for up to 7 days. May cause drowsiness -     predniSONE  10 MG (21) tablet therapy pack; Take 1 tablet (10 mg total) by mouth See administration instructions. Take as directed      Disposition Status: Home  Progress note signed by Greig Doss, PA on 07/26/23 at 10:26 AM

## 2024-04-19 ENCOUNTER — Emergency Department
Admission: EM | Admit: 2024-04-19 | Discharge: 2024-04-19 | Disposition: A | Discharge status: Home / Self Care | Attending: Emergency Medicine | Admitting: Emergency Medicine

## 2024-04-19 ENCOUNTER — Encounter: Payer: Self-pay | Admitting: Emergency Medicine

## 2024-04-19 ENCOUNTER — Other Ambulatory Visit: Payer: Self-pay

## 2024-04-19 DIAGNOSIS — Y92512 Supermarket, store or market as the place of occurrence of the external cause: Secondary | ICD-10-CM | POA: Diagnosis not present

## 2024-04-19 DIAGNOSIS — S39012A Strain of muscle, fascia and tendon of lower back, initial encounter: Secondary | ICD-10-CM | POA: Insufficient documentation

## 2024-04-19 DIAGNOSIS — Y990 Civilian activity done for income or pay: Secondary | ICD-10-CM | POA: Insufficient documentation

## 2024-04-19 DIAGNOSIS — X58XXXA Exposure to other specified factors, initial encounter: Secondary | ICD-10-CM | POA: Diagnosis not present

## 2024-04-19 DIAGNOSIS — M545 Low back pain, unspecified: Secondary | ICD-10-CM | POA: Diagnosis not present

## 2024-04-19 MED ORDER — CYCLOBENZAPRINE HCL 10 MG PO TABS
10.0000 mg | ORAL_TABLET | Freq: Once | ORAL | Status: AC
  Administered 2024-04-19: 10 mg via ORAL
  Filled 2024-04-19: qty 1

## 2024-04-19 MED ORDER — CYCLOBENZAPRINE HCL 10 MG PO TABS
10.0000 mg | ORAL_TABLET | Freq: Three times a day (TID) | ORAL | 0 refills | Status: DC | PRN
Start: 2024-04-19 — End: 2024-07-06

## 2024-04-19 MED ORDER — KETOROLAC TROMETHAMINE 30 MG/ML IJ SOLN
30.0000 mg | Freq: Once | INTRAMUSCULAR | Status: AC
Start: 2024-04-19 — End: 2024-04-19
  Administered 2024-04-19: 30 mg via INTRAMUSCULAR
  Filled 2024-04-19: qty 1

## 2024-04-19 MED ORDER — PREDNISONE 50 MG PO TABS: 50.0000 mg | ORAL_TABLET | Freq: Every day | ORAL | 0 refills | Status: DC

## 2024-04-19 MED ORDER — IBUPROFEN 600 MG PO TABS: 600.0000 mg | ORAL_TABLET | Freq: Four times a day (QID) | ORAL | 0 refills | Status: AC | PRN

## 2024-04-19 NOTE — ED Provider Notes (Signed)
 [facility] Provider Note    Event Date/Time   First MD Initiated Contact with Patient 04/19/24 [phone]     (approximate)   History   Back Pain   HPI  Albert Silva is a 43 y.o. male with a history of lumbar radiculopathy who presents with low back pain with an acute flareup today.  The patient states that he has had similar pain before but this episode is worse.  He states that the pain flared up a few days ago.  He came to work at [employer] today wearing a back brace and was doing well, but then this morning he was wrapping a pallet when the pain suddenly became more intense.  He denies any numbness or weakness in the legs.  He has no urinary retention or incontinence.  He states the pain is worse with walking, however it does not really radiate down his legs.  He denies any direct trauma or injury to his back.  I reviewed the past medical records.  The patient's most recent outpatient encounter [facility] with [facility] in December of last year also for back pain, diagnosed with lumbar radiculopathy.   Physical Exam   Triage Vital Signs: ED Triage Vitals [04/19/24 0506]  Encounter Vitals Group     BP (!) 140/89     Girls Systolic BP Percentile      Girls Diastolic BP Percentile      Boys Systolic BP Percentile      Boys Diastolic BP Percentile      Pulse Rate 79     Resp 18     Temp 98.4 F (36.9 C)     Temp Source Oral     SpO2 100 %     Weight      Height      Head Circumference      Peak Flow      Pain Score 10     Pain Loc      Pain Education      Exclude from Growth Chart     Most recent vital signs: Vitals:   04/19/24 0730 04/19/24 0800  BP: (!) 131/100 (!) 130/94  Pulse: 69 82  Resp:  16  Temp:    SpO2: 99% 100%     General: Alert, well-appearing, no distress.  CV:  Good peripheral perfusion.  Resp:  Normal effort.  Abd:  No distention.  Other:  No midline spinal tenderness.  Bilateral lumbar mild muscle tenderness.   Negative straight leg raise bilaterally.  5/5 motor strength and intact sensation of bilateral lower extremities.   ED Results / Procedures / Treatments   Labs (all labs ordered are listed, but only abnormal results are displayed) Labs Reviewed - No data to display   EKG   RADIOLOGY   PROCEDURES:  Critical Care performed: No  Procedures   MEDICATIONS ORDERED IN ED: Medications  ketorolac  (TORADOL ) 30 MG/ML injection 30 mg (30 mg Intramuscular Given 04/19/24 0813)  cyclobenzaprine  (FLEXERIL ) tablet 10 mg (10 mg Oral Given 04/19/24 0813)     IMPRESSION / MDM / ASSESSMENT AND PLAN / ED COURSE  I reviewed the triage vital signs and the nursing notes.  Differential diagnosis includes, but is not limited to, lumbar radiculopathy, muscle strain or spasm.  There is no evidence of cauda equina syndrome.  The patient has no direct trauma and there is no indication for imaging.  We will give NSAID, muscle relaxant, and reassess.  Patient's  presentation is most consistent with acute, uncomplicated illness.  ----------------------------------------- 9:32 AM on 04/19/2024 -----------------------------------------  The pain is improved.  The patient is stable for discharge home.  Return precautions provided, and he expresses understanding.  FINAL CLINICAL IMPRESSION(S) / ED DIAGNOSES   Final diagnoses:  Strain of lumbar region, initial encounter     Rx / DC Orders   ED Discharge Orders          Ordered    ibuprofen  (ADVIL ) 600 MG tablet  Every 6 hours PRN        04/19/24 0930    cyclobenzaprine  (FLEXERIL ) 10 MG tablet  3 times daily PRN        04/19/24 0930    predniSONE  (DELTASONE ) 50 MG tablet  Daily        04/19/24 0930             Note:  This document was prepared using Dragon voice recognition software and may include unintentional dictation errors.    Jacolyn Pae, MD 04/19/24 [phone]

## 2024-04-19 NOTE — ED Triage Notes (Signed)
 Pt arrives c/o lower back pain that started last Sunday, denies injury. Pt reports he went to work this morning w/ a back brace on and was wrapping a pallet and pain became more intense w/ radiation down bilateral legs.

## 2024-04-19 NOTE — Discharge Instructions (Addendum)
 Take prednisone  daily for the next 5 days, and ibuprofen  up to every 6 hours for pain.  You can also take the Flexeril  if you are having spasms or tightness.  Follow-up with your regular doctor.  Return to the ER for new, worsening, or persistent severe pain, weakness or numbness in your legs, urinary symptoms, difficulty walking, or any other new or worsening symptoms that concern you.

## 2024-04-23 ENCOUNTER — Other Ambulatory Visit: Payer: Self-pay

## 2024-04-23 ENCOUNTER — Ambulatory Visit (INDEPENDENT_AMBULATORY_CARE_PROVIDER_SITE_OTHER)

## 2024-04-23 ENCOUNTER — Encounter (HOSPITAL_COMMUNITY): Payer: Self-pay | Admitting: Emergency Medicine

## 2024-04-23 ENCOUNTER — Ambulatory Visit (HOSPITAL_COMMUNITY)
Admission: EM | Admit: 2024-04-23 | Discharge: 2024-04-23 | Disposition: A | Discharge status: Home / Self Care | Attending: Internal Medicine | Admitting: Internal Medicine

## 2024-04-23 DIAGNOSIS — M4807 Spinal stenosis, lumbosacral region: Secondary | ICD-10-CM | POA: Diagnosis not present

## 2024-04-23 DIAGNOSIS — M5441 Lumbago with sciatica, right side: Secondary | ICD-10-CM | POA: Diagnosis not present

## 2024-04-23 DIAGNOSIS — M47817 Spondylosis without myelopathy or radiculopathy, lumbosacral region: Secondary | ICD-10-CM | POA: Diagnosis not present

## 2024-04-23 DIAGNOSIS — M5440 Lumbago with sciatica, unspecified side: Secondary | ICD-10-CM

## 2024-04-23 DIAGNOSIS — M47816 Spondylosis without myelopathy or radiculopathy, lumbar region: Secondary | ICD-10-CM | POA: Diagnosis not present

## 2024-04-23 MED ORDER — KETOROLAC TROMETHAMINE 30 MG/ML IJ SOLN
INTRAMUSCULAR | Status: AC
  Filled 2024-04-23: qty 1

## 2024-04-23 MED ORDER — KETOROLAC TROMETHAMINE 30 MG/ML IJ SOLN
30.0000 mg | Freq: Once | INTRAMUSCULAR | Status: AC
  Administered 2024-04-23: 30 mg via INTRAMUSCULAR

## 2024-04-23 NOTE — ED Triage Notes (Signed)
 Pain in back for 4 days.  Patient was taken to [facility] for treatment.  Received a shot, prednisone  and anti-spasm medicine.    Having sharp pain, shooting pains radiating down both legs.  No bladder or bowel issue.  States medicine is not helping

## 2024-04-23 NOTE — ED Provider Notes (Signed)
 [facility]    CSN: [id number] Arrival date & time: 04/23/24  9040      History   Chief Complaint Chief Complaint  Patient presents with   Back Pain    HPI Albert Silva is a 43 y.o. male who presents urgent care endorsing acute on chronic lumbar back pain.  He was recently evaluated at [facility] on Saturday 9/6 endorsing acute on chronic lumbar back pain with radiculopathy.  Pain acutely worsened while wrapping a pallet at work.  He was given an IM injection of Toradol  and prescribed prednisone , ibuprofen , and Flexeril .  Pain has not worsened since that time but has not significantly improved.  He endorses pain mostly with standing from a seated position and also with extended periods of ambulation.  He has minimal symptoms at rest.  Pain radiates to the bilateral buttocks but he denies radiation of pain into the lower extremities.  Denies numbness/weakness in the lower extremities, saddle anesthesia, bowel/bladder incontinence, fever/chills, unintentional weight loss, and night sweats.  He presents today to discuss additional treatment options.   History reviewed. No pertinent past medical history.  There are no active problems to display for this patient.  History reviewed. No pertinent surgical history.   Home Medications    Prior to Admission medications   Medication Sig Start Date End Date Taking? Authorizing Provider  acetaminophen  (TYLENOL ) 500 MG tablet Take 2 tablets (1,000 mg total) by mouth every 6 (six) hours as needed. 04/06/22   Enedelia Dorna HERO, FNP  cyclobenzaprine  (FLEXERIL ) 10 MG tablet Take 1 tablet (10 mg total) by mouth 3 (three) times daily as needed. 04/19/24   Siadecki, Sebastian, MD  ibuprofen  (ADVIL ) 600 MG tablet Take 1 tablet (600 mg total) by mouth every 6 (six) hours as needed. 04/19/24   Siadecki, Sebastian, MD  predniSONE  (DELTASONE ) 50 MG tablet Take 1 tablet (50 mg total) by mouth daily at 12 noon for 5 days. 04/19/24 04/24/24  Jacolyn Pae, MD  tiZANidine  (ZANAFLEX ) 2 MG tablet Take 1 tablet (2 mg total) by mouth every 8 (eight) hours as needed for muscle spasms. 08/08/21   Arlyss Leita BRAVO, PA-C    Family History Family History  Problem Relation Age of Onset   Diabetes Father     Social History Social History   Tobacco Use   Smoking status: Never  Vaping Use   Vaping status: Never Used  Substance Use Topics   Alcohol use: Never   Drug use: Never   Allergies   Patient has no known allergies.   Review of Systems Review of Systems  Constitutional:  Negative for chills, diaphoresis, fever and unexpected weight change.  Genitourinary:  Negative for difficulty urinating.  Musculoskeletal:  Positive for back pain.  Neurological:  Negative for weakness and numbness.  All other systems reviewed and are negative.  Physical Exam Triage Vital Signs ED Triage Vitals  Encounter Vitals Group     BP 04/23/24 1056 131/88     Girls Systolic BP Percentile --      Girls Diastolic BP Percentile --      Boys Systolic BP Percentile --      Boys Diastolic BP Percentile --      Pulse Rate 04/23/24 1056 92     Resp 04/23/24 1056 18     Temp 04/23/24 1056 98.4 F (36.9 C)     Temp Source 04/23/24 1056 Oral     SpO2 04/23/24 1056 98 %     Weight --  Height --      Head Circumference --      Peak Flow --      Pain Score 04/23/24 1054 10     Pain Loc --      Pain Education --      Exclude from Growth Chart --    No data found.  Updated Vital Signs BP 131/88 (BP Location: Left Arm) Comment (BP Location): large cuff  Pulse 92   Temp 98.4 F (36.9 C) (Oral)   Resp 18   SpO2 98%   Physical Exam Vitals reviewed.  Constitutional:      General: He is not in acute distress.    Appearance: Normal appearance. He is not toxic-appearing.  Musculoskeletal:     Comments: Lumbar spine No obvious deformity on inspection ROM generally intact but he endorses pain with extension to a neutral position after  forward flexion TTP over the paraspinal muscles of the lumbar region bilaterally 5/5 strength in the lower extremities Negative SLR bilaterally 2+ symmetric reflexes bilaterally    UC Treatments / Results  Labs (all labs ordered are listed, but only abnormal results are displayed) Labs Reviewed - No data to display  EKG   Radiology DG Lumbar Spine Complete Result Date: 04/23/2024 EXAM: 4 or more VIEW(S) XRAY OF THE LUMBAR SPINE 04/23/2024 11:47:07 AM COMPARISON: None available. CLINICAL HISTORY: Bilateral lumbar back pain with b/l sciatica. Pain in back for 4 days. Patient was taken to [facility] for treatment. Received a shot, prednisone  and anti-spasm medicine. Having sharp pain, shooting pains radiating down both legs. No bladder or bowel issue. States medicine is not helping. FINDINGS: LUMBAR SPINE: BONES: No acute fracture. No aggressive appearing osseous lesion. Alignment is normal. DISCS AND DEGENERATIVE CHANGES: Mild disc space narrowing posteriorly at L5-S1. 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 SOFT TISSUES: No acute abnormality. IMPRESSION: 1. Mild disc space narrowing posteriorly at L5-S1. 2. 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 Electronically signed by: Donnice Mania MD 04/23/2024 12:07 PM EDT RP Workstation: [id number]    Procedures Procedures (including critical care time)  Medications Ordered in UC Medications  ketorolac  (TORADOL ) 30 MG/ML injection 30 mg (30 mg Intramuscular Given 04/23/24 1132)    Initial Impression / Assessment and Plan / UC Course  I have reviewed the triage vital signs and the nursing notes.  Pertinent labs & imaging results that were available during my care of the patient were reviewed by me and considered in my medical decision making (see chart for details).    Patient presenting to urgent care today endorsing acute on chronic lumbar back pain.  Previous evaluation at [facility] on 9/6.  Treated for lumbar strain with Toradol ,  prednisone , ibuprofen , and Flexeril .  He returns today endorsing persistent pain with minimal improvement.  No red flag symptoms identified.  His exam is reassuring. Will administer an additional IM Toradol  injection and check x-rays of the lumbar spine given acute worsening of pain with no improvement despite appropriate treatment measures earlier this week.  X-rays of the lumbar spine reviewed, showing mild multilevel degenerative changes greatest in the lower lumbar spine.  These were reviewed with the patient.  On reassessment, he endorses mild improvement of pain with Toradol  injection.  We discussed treatment going forward.  I placed a referral to sports medicine for further evaluation and also placed a referral to physical therapy as this will be a central part of his treatment.  Patient is agreeable to this plan.  He is  medically stable for discharge at this time.  Final Clinical Impressions(s) / UC Diagnoses   Final diagnoses:  Back pain of lumbar region with sciatica     Discharge Instructions      The xrays of your back showed mild degenerative changes but otherwise no acute findings to explain your pain. You were given an injection of toradol  to help with pain relief. As we discussed, I placed referrals to sports medicine and physical therapy for further evaluation and rehabilitation respectively.      ED Prescriptions   None    PDMP not reviewed this encounter.   Melvenia Manus BRAVO, MD 04/23/24 1250

## 2024-04-23 NOTE — Discharge Instructions (Signed)
 The xrays of your back showed mild degenerative changes but otherwise no acute findings to explain your pain. You were given an injection of toradol  to help with pain relief. As we discussed, I placed referrals to sports medicine and physical therapy for further evaluation and rehabilitation respectively.

## 2024-04-24 ENCOUNTER — Encounter: Payer: Self-pay | Admitting: Family Medicine

## 2024-04-24 ENCOUNTER — Telehealth: Payer: Self-pay | Admitting: Family Medicine

## 2024-04-24 ENCOUNTER — Telehealth: Payer: Self-pay

## 2024-04-24 ENCOUNTER — Ambulatory Visit (INDEPENDENT_AMBULATORY_CARE_PROVIDER_SITE_OTHER): Admitting: Family Medicine

## 2024-04-24 VITALS — BP 124/88 | HR 87 | Ht 69.0 in | Wt 221.2 lb

## 2024-04-24 DIAGNOSIS — M5440 Lumbago with sciatica, unspecified side: Secondary | ICD-10-CM

## 2024-04-24 DIAGNOSIS — R29818 Other symptoms and signs involving the nervous system: Secondary | ICD-10-CM

## 2024-04-24 MED ORDER — MELOXICAM 15 MG PO TABS: 15.0000 mg | ORAL_TABLET | Freq: Every day | ORAL | 0 refills | Status: DC

## 2024-04-24 MED ORDER — PREDNISONE 10 MG (21) PO TBPK: ORAL_TABLET | Freq: Every day | ORAL | 0 refills | Status: DC

## 2024-04-24 NOTE — Addendum Note (Signed)
 Addended by: Amela Handley on: 04/24/2024 10:01 AM   Modules accepted: Level of Service

## 2024-04-24 NOTE — Progress Notes (Signed)
 Name: MARY HOCKEY   Date of Visit: 04/24/24   Date of last visit with me: Visit date not found   CHIEF COMPLAINT:  Chief Complaint  Patient presents with   Establish Care    New patient. Severe back pain. Went to hospital, they gave him shot, did not get better, went to urgent care yesterday gave him another shot and xray. Can not use bathroom while sitting down, urinating is fine.        HPI:  Discussed the use of AI scribe software for clinical note transcription with the patient, who gave verbal consent to proceed.  History of Present Illness   AYDN FERRARA is a 43 year old male who presents with severe back pain radiating down the right leg.  He has been experiencing severe back pain since last Saturday morning, which began while he was leaving work. The pain was so intense that he was taken to [facility]. He describes the pain as shooting down the back of his right leg, stopping at the calf. Although he has had back pain in the past, this episode is more severe.  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 He was bending low to move a pallet when he felt everything 'went loose' and he couldn't stand or walk properly. The pain is worse on the right side and is described as very localized when lying flat, but it does not shoot down the leg in this position.  He received a Toradol  injection at [facility], which provided temporary relief, but a subsequent injection did not help. He was also prescribed ibuprofen  and prednisone , which he completed yesterday, but he reports no significant improvement from these medications.  This morning, he experienced significant pressure and pain while using the commode, which prompted him to seek medical attention today. He notes that sitting down increases the pressure and pain in his back. Mild numbness or tingling in the testicular area, but significant pain when sitting on the commode. He has been using ice,  which worsened the pain.         OBJECTIVE:       09/08/2015    1:27 PM  Depression screen PHQ 2/9  Decreased Interest 0  Down, Depressed, Hopeless 0  PHQ - 2 Score 0     BP Readings from Last 3 Encounters:  04/24/24 124/88  04/23/24 131/88  04/19/24 133/89    BP 124/88   Pulse 87   Ht 5' 9 (1.753 m)   Wt 221 lb 3.2 oz (100.3 kg)   SpO2 98%   BMI 32.67 kg/m    Physical Exam   MUSCULOSKELETAL: Tenderness in the lower back at the L5-S1 region. No tenderness in the upper or mid back. No pain on leg raise.      Physical Exam  Inspection reveals no gross abnormalities of the lumbar spine.  There is tenderness to palpation over the S1 area of the spine itself.  Straight leg test is positive bilaterally with the right worse than left.  Sensation is decreased along the L4/L5 dermatome as well as the L5/S1 dermatome.  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  Given patient's  symptoms with concern for myelopathy, will recommend MRI.  Pain worsens with sitting and straining. Previous Toradol  and prednisone  provided minimal relief. Localized tenderness and shooting pain indicate nerve involvement. - Order urgent MRI at [facility] to confirm diagnosis and assess severity. - Prescribe longer course of steroids and meloxicam  for anti-inflammatory effect. - Plan potential steroid injection into nerve based on MRI results. - Discussed potential need for surgery if injection is ineffective, starting with conservative management. - Advise rest with limited activity, avoiding heavy lifting. - Use heating pad to alleviate symptoms. - Provide work note for rest of the week off. -  Instruct to drink plenty of water with medications. - Coordinate with imaging center for MRI scheduling.         Zolton Dowson A. Vita MD [facility]

## 2024-04-24 NOTE — Addendum Note (Signed)
 Addended by: Margaurite Salido on: 04/24/2024 11:12 AM   Modules accepted: Orders

## 2024-04-24 NOTE — Telephone Encounter (Signed)
 [facility] called and spoke to [place], Pensions consultant about the refill(s) meloxicam  requested. Advised it was sent on 04/24/24 #30/0 refill(s). Advised patient stated it was not picked up. She says they are working on it and it should be ready in about 30 minutes or so. Patient is enrolled in text messaging so should receive a notification of it being ready.   Copied from CRM #[id number]. Topic: Clinical - Prescription Issue >> Apr 24, 2024  3:57 PM Carlatta H wrote: Reason for CRM: The patient did not receive meloxicam  (MOBIC ) 15 MG tablet [[id number]] and would like to know the status

## 2024-04-24 NOTE — Telephone Encounter (Signed)
 Spoke with pharmacy and patient has picked up prescription.

## 2024-04-24 NOTE — Patient Instructions (Addendum)
 Please take your meloxicam  1 pill daily  Please follow your steroid package instructions.   Please call schedule your MRI.

## 2024-04-24 NOTE — Telephone Encounter (Signed)
 Copied from CRM #[id number]. Topic: Clinical - Prescription Issue >> Apr 24, 2024 10:07 AM Graeme ORN wrote: Reason for CRM: predniSONE  (STERAPRED UNI-PAK 21 TAB) 10 MG (21) TBPK tablet - Qty listed 66  which would be 2 of 6pack - they don't dispense like that. Says 12 day. Need to clarify. Thank You

## 2024-04-24 NOTE — Telephone Encounter (Signed)
 Copied from CRM [id number]. Topic: General - Other >> Apr 24, 2024 12:00 PM [name] wrote: Reason for CRM: Patient spoke with pharmacy and they stated that they sent a message over to Dr. Vita Morrow but he has not responded yet. When asked about what medication the message or inquiry is about patient states he does not know, all he knows is the pharmacy sent a message and they are still waiting to hear back  Spoke with pharmacy and patient has picked up prescription.

## 2024-04-24 NOTE — Telephone Encounter (Signed)
 Copied from CRM #[id number]. Topic: Clinical - Medical Advice >> Apr 24, 2024  1:33 PM Carlatta H wrote: Reason for CRM: Patient called due to [facility] imagining stating that his insurance would not cover//Patient was calling advised that his insurance rep stated it would be covered//Patient is concerned his appointment would be push further out  Spoke with pharmacy and patient has picked up prescription.

## 2024-04-25 ENCOUNTER — Ambulatory Visit
Admission: RE | Admit: 2024-04-25 | Discharge: 2024-04-25 | Disposition: A | Discharge status: Home / Self Care | Source: Ambulatory Visit | Attending: Family Medicine

## 2024-04-25 ENCOUNTER — Other Ambulatory Visit

## 2024-04-25 ENCOUNTER — Telehealth: Payer: Self-pay | Admitting: Nurse Practitioner

## 2024-04-25 ENCOUNTER — Emergency Department (HOSPITAL_COMMUNITY)
Admission: EM | Admit: 2024-04-25 | Discharge: 2024-04-25 | Disposition: A | Discharge status: Home / Self Care | Attending: Emergency Medicine | Admitting: Emergency Medicine

## 2024-04-25 DIAGNOSIS — M549 Dorsalgia, unspecified: Secondary | ICD-10-CM | POA: Diagnosis not present

## 2024-04-25 DIAGNOSIS — M48061 Spinal stenosis, lumbar region without neurogenic claudication: Secondary | ICD-10-CM | POA: Diagnosis not present

## 2024-04-25 DIAGNOSIS — R29818 Other symptoms and signs involving the nervous system: Secondary | ICD-10-CM

## 2024-04-25 DIAGNOSIS — M5127 Other intervertebral disc displacement, lumbosacral region: Secondary | ICD-10-CM | POA: Diagnosis not present

## 2024-04-25 DIAGNOSIS — M5440 Lumbago with sciatica, unspecified side: Secondary | ICD-10-CM

## 2024-04-25 DIAGNOSIS — M47817 Spondylosis without myelopathy or radiculopathy, lumbosacral region: Secondary | ICD-10-CM | POA: Diagnosis not present

## 2024-04-25 NOTE — ED Provider Notes (Signed)
  EMERGENCY DEPARTMENT AT [facility] Provider Note   CSN: [id number] Arrival date & time: 04/25/24  2226     Patient presents with: Back Pain   Albert Silva is a 43 y.o. male.   43 year old male presents with abnormal MRI obtained outpatient this evening concerning for cauda equina.  Patient states that he developed back pain about a week ago, radiates down both of his legs, is worse with straining or trying to rise from seated position such as standing up from the commode.  He denies any loss of bowel or bladder control, saddle paresthesias, weakness in his legs.  Patient states his pain is currently manageable with regimen prescribed by urgent care.       Prior to Admission medications   Medication Sig Start Date End Date Taking? Authorizing Provider  acetaminophen  (TYLENOL ) 500 MG tablet Take 2 tablets (1,000 mg total) by mouth every 6 (six) hours as needed. 04/06/22   Enedelia Dorna HERO, FNP  cyclobenzaprine  (FLEXERIL ) 10 MG tablet Take 1 tablet (10 mg total) by mouth 3 (three) times daily as needed. 04/19/24   Siadecki, Sebastian, MD  ibuprofen  (ADVIL ) 600 MG tablet Take 1 tablet (600 mg total) by mouth every 6 (six) hours as needed. 04/19/24   Jacolyn Pae, MD  meloxicam  (MOBIC ) 15 MG tablet Take 1 tablet (15 mg total) by mouth daily. 04/24/24   Jha, Panav, MD  predniSONE  (STERAPRED UNI-PAK 21 TAB) 10 MG (21) TBPK tablet Take by mouth daily. Follow insturctions on package 04/24/24   Jha, Panav, MD  tiZANidine  (ZANAFLEX ) 2 MG tablet Take 1 tablet (2 mg total) by mouth every 8 (eight) hours as needed for muscle spasms. 08/08/21   Graham, Elmor Kost E, PA-C    Allergies: Patient has no known allergies.    Review of Systems Negative except as per HPI Updated Vital Signs BP (!) 131/96 (BP Location: Right Arm)   Pulse (!) 101   Temp 98.1 F (36.7 C)   Resp 16   SpO2 100%   Physical Exam Vitals and nursing note reviewed.  Constitutional:      General:  He is not in acute distress.    Appearance: He is well-developed. He is not diaphoretic.  HENT:     Head: Normocephalic and atraumatic.  Cardiovascular:     Pulses: Normal pulses.  Pulmonary:     Effort: Pulmonary effort is normal.  Abdominal:     Palpations: Abdomen is soft.     Tenderness: There is no abdominal tenderness.  Musculoskeletal:        General: No swelling, tenderness or deformity.     Thoracic back: No tenderness or bony tenderness.     Lumbar back: No tenderness or bony tenderness. Negative right straight leg raise test and negative left straight leg raise test.  Skin:    General: Skin is warm and dry.     Findings: No erythema or rash.  Neurological:     Mental Status: He is alert and oriented to person, place, and time.     Sensory: No sensory deficit.     Motor: No weakness.     Gait: Gait normal.     Deep Tendon Reflexes: Babinski sign absent on the right side. Babinski sign absent on the left side.     Reflex Scores:      Patellar reflexes are 2+ on the right side and 2+ on the left side.      Achilles reflexes are 1+ on the  right side and 1+ on the left side. Psychiatric:        Behavior: Behavior normal.     (all labs ordered are listed, but only abnormal results are displayed) Labs Reviewed - No data to display  EKG: None  Radiology: MR Lumbar Spine Wo Contrast Result Date: 04/25/2024 CLINICAL DATA:  Lumbar radiculitis, back pain began after bending to pick up an item EXAM: MRI LUMBAR SPINE WITHOUT CONTRAST TECHNIQUE: Multiplanar, multisequence MR imaging of the lumbar spine was performed. No intravenous contrast was administered. COMPARISON:  04/23/2024 FINDINGS: Segmentation: The lowest lumbar type non-rib-bearing vertebra is labeled as L5. Alignment: No subluxation. Straightening of the normal lumbar lordosis. 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 No significant vertebral marrow edema is identified. Conus medullaris and  cauda equina: Conus extends to the L1-2 level. Conus and cauda equina appear normal. Paraspinal and other soft tissues: Fluid signal intensity lesion of the right kidney is most compatible with a simple cyst. No further imaging workup of this lesion is indicated. Disc levels: L1-2: Unremarkable L2-3: Moderate central stenosis due to disc bulge and short pedicles. L3-4: Moderate central stenosis due to disc bulge and short pedicles. Mild displacement of both L3 nerves in the lateral extraforaminal space due to disc bulge. L4-5: Markedly severe central stenosis due to a large central disc extrusion which extends cephalad. Moderate associated left foraminal stenosis partially due to left lateral foraminal disc protrusion. L5-S1: Moderate bilateral foraminal stenosis due to disc bulge, facet arthropathy, and short pedicles. IMPRESSION: 1. 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 This causes risk for cauda equina syndrome. 2. Moderate central stenosis at L2-3 and L3-4 due to disc bulge and short pedicles. 3. Moderate bilateral foraminal stenosis at L5-S1 due to disc bulge, facet arthropathy, and short pedicles. Electronically Signed   By: Ryan Salvage M.D.   On: 04/25/2024 18:57     Procedures   Medications Ordered in the ED - No data to display                                  Medical Decision Making  This patient presents to the ED for concern of low back pain, this involves an extensive number of treatment options, and is a complaint that carries with it a high risk of complications and morbidity.  The differential diagnosis includes lumbar radiculopathy, stenosis, cauda equina    Co morbidities / Chronic conditions that complicate the patient evaluation  Low back pain   Additional history obtained:  Additional history obtained from EMR External records from outside source obtained and reviewed including MRI obtained earlier  today: IMPRESSION: 1. 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 This causes risk for cauda equina syndrome. 2. Moderate central stenosis at L2-3 and L3-4 due to disc bulge and short pedicles. 3. Moderate bilateral foraminal stenosis at L5-S1 due to disc bulge, facet arthropathy, and short pedicles.   Problem List / ED Course / Critical interventions / Medication management  43 year old male presents to emergency room after contact from his provider following MRI obtained at 6:30 PM tonight with concern for cauda equina.  Patient with history of low back pain, worse with trying to stand, worse with bearing down for bowel movement, radiates down his legs.  He denies saddle paresthesias, loss of bowel or bladder control,  leg weakness or gait abnormality.  On exam, he is well-appearing, leg strength is intact, gait intact, reflexes symmetric, sensation intact.  MRI reviewed.  Discussed with neurosurgery.  As patient does not have any symptoms of cauda equina at this time, may be discharged to follow-up in clinic next week.  Patient given strict return to ER precautions, should he have any concerning symptoms he should return to the emergency room.  In the meantime, provided with lifting restrictions.  He feels like his pain is generally improved to controlled with medications provided at urgent care earlier. I have reviewed the patients home medicines and have made adjustments as needed   Consultations Obtained:  I requested consultation with the neurosurgery PA- Bergman,  and discussed lab and imaging findings as well as pertinent plan - they recommend: close follow up in office next week with return to ER precautions.    Social Determinants of Health:  Lives with family   Test / Admission - Considered:  Stable for dc      Final diagnoses:  None    ED Discharge Orders     None          Beverley Leita DELENA DEVONNA 04/25/24 2323    Franklyn Sid SAILOR, MD 04/25/24 2348

## 2024-04-25 NOTE — Telephone Encounter (Signed)
 Received STAT MRI results from [facility] Reading Room. MRI concerning for Cauda Equina Syndrome. Patient called twice with no response. Ruby with Imaging Dept also reported as unable to reach the patient. I have reached out to patient via MyChart and notified Dr. Vita of the results.   Will continue to attempt to give the information to the patient via telephone.    ADDENDUM: Dr. Vita was able to reach the patient via telephone and instruct him to seek emergency evaluation.

## 2024-04-25 NOTE — ED Triage Notes (Addendum)
 Patient states that he had MRI done today and afterwards provider called and told patient to go to ED that he had cauda equina. Patient ambulated to registration in no distress.

## 2024-04-25 NOTE — Discharge Instructions (Addendum)
 Follow up with neurosurgery- call Monday for an appointment next week. Return to the ER for any concerning symptoms especially groin numbness/tingling, leg weakness, loss of bowel or bladder control.  Lifting restrictions as discussed.

## 2024-04-25 NOTE — ED Notes (Signed)
Pt verbalized understanding of discharge instructions. Pt ambulated from ed with steady gait.  

## 2024-04-28 ENCOUNTER — Ambulatory Visit: Payer: Self-pay | Admitting: Family Medicine

## 2024-05-01 DIAGNOSIS — M5126 Other intervertebral disc displacement, lumbar region: Secondary | ICD-10-CM | POA: Diagnosis not present

## 2024-05-01 DIAGNOSIS — G834 Cauda equina syndrome: Secondary | ICD-10-CM | POA: Diagnosis not present

## 2024-05-06 ENCOUNTER — Other Ambulatory Visit

## 2024-05-15 DIAGNOSIS — M5126 Other intervertebral disc displacement, lumbar region: Secondary | ICD-10-CM | POA: Diagnosis not present

## 2024-05-15 DIAGNOSIS — G834 Cauda equina syndrome: Secondary | ICD-10-CM | POA: Diagnosis not present

## 2024-05-20 DIAGNOSIS — M48061 Spinal stenosis, lumbar region without neurogenic claudication: Secondary | ICD-10-CM | POA: Diagnosis not present

## 2024-05-20 DIAGNOSIS — M5116 Intervertebral disc disorders with radiculopathy, lumbar region: Secondary | ICD-10-CM | POA: Diagnosis not present

## 2024-05-20 DIAGNOSIS — G834 Cauda equina syndrome: Secondary | ICD-10-CM | POA: Diagnosis not present

## 2024-06-03 ENCOUNTER — Ambulatory Visit: Admitting: Family Medicine

## 2024-07-06 ENCOUNTER — Ambulatory Visit (HOSPITAL_COMMUNITY)
Admission: RE | Admit: 2024-07-06 | Discharge: 2024-07-06 | Disposition: A | Discharge status: Home / Self Care | Source: Ambulatory Visit | Attending: Family Medicine | Admitting: Family Medicine

## 2024-07-06 ENCOUNTER — Encounter (HOSPITAL_COMMUNITY): Payer: Self-pay

## 2024-07-06 VITALS — BP 140/102 | HR 79 | Temp 98.7°F | Resp 16

## 2024-07-06 DIAGNOSIS — H938X3 Other specified disorders of ear, bilateral: Secondary | ICD-10-CM

## 2024-07-06 NOTE — ED Provider Notes (Signed)
 [facility] [place]  Note:  This document was prepared using Conservation officer, historic buildings and may include unintentional dictation errors.  MRN: [id number] DOB: Apr 17, 1981  Subjective:   Albert Silva is a 43 y.o. male presenting for evaluation of bilateral ears, patient complaining of ear fullness x 4 to 5 days.  Patient denies any past history of cerumen impaction with need for removal.  Patient denies any ear pain, drainage, fever, loss of hearing.  Patient reports he cleans his ears after the shower with Q-tips and was worried that he may have pushed wax down in his ear where it could not be removed.  Patient denies any other medical concern at this time.  No current facility-administered medications for this encounter.  Current Outpatient Medications:    acetaminophen  (TYLENOL ) 500 MG tablet, Take 2 tablets (1,000 mg total) by mouth every 6 (six) hours as needed., Disp: 30 tablet, Rfl: 0   ibuprofen  (ADVIL ) 600 MG tablet, Take 1 tablet (600 mg total) by mouth every 6 (six) hours as needed., Disp: 30 tablet, Rfl: 0   No Known Allergies  History reviewed. No pertinent past medical history.   History reviewed. No pertinent surgical history.  Family History  Problem Relation Age of Onset   Diabetes Father     Social History   Tobacco Use   Smoking status: Never  Vaping Use   Vaping status: Never Used  Substance Use Topics   Alcohol use: Never   Drug use: Never    ROS Refer to HPI for ROS details.  Objective:    Vitals: BP (!) 140/102 (BP Location: Right Arm)   Pulse 79   Temp 98.7 F (37.1 C) (Oral)   Resp 16   SpO2 94%   Physical Exam Vitals and nursing note reviewed.  Constitutional:      General: He is not in acute distress.    Appearance: Normal appearance. He is well-developed. He is not ill-appearing or toxic-appearing.  HENT:     Head: Normocephalic.     Right Ear: Tympanic membrane, ear canal and external ear normal. There is no  impacted cerumen.     Left Ear: Tympanic membrane, ear canal and external ear normal. There is no impacted cerumen.     Nose: Nose normal.     Mouth/Throat:     Mouth: Mucous membranes are moist.  Cardiovascular:     Rate and Rhythm: Normal rate.  Pulmonary:     Effort: Pulmonary effort is normal. No respiratory distress.  Skin:    General: Skin is warm and dry.  Neurological:     General: No focal deficit present.     Mental Status: He is alert and oriented to person, place, and time.  Psychiatric:        Mood and Affect: Mood normal.        Behavior: Behavior normal.     Procedures  No results found for this or any previous visit (from the past 24 hours).  Assessment and Plan :     Discharge Instructions       1. Ear pressure, bilateral (Primary) - Bilateral ear canals clear of cerumen, no erythema, no swelling, no purulent discharge, no sign of infection. - Continue to monitor symptoms if there is any change in severity if there is any increase in ear pressure, pain, drainage, fever, loss of hearing follow-up for further evaluation and urgent care or ER.      Stashia Sia B Peytyn Trine   Dadrian Ballantine B,  NP 07/06/24 [id number]

## 2024-07-06 NOTE — ED Triage Notes (Signed)
 Patient here today with c/o bilat ear fullness X 4-5 days.

## 2024-07-06 NOTE — Discharge Instructions (Signed)
  1. Ear pressure, bilateral (Primary) - Bilateral ear canals clear of cerumen, no erythema, no swelling, no purulent discharge, no sign of infection. - Continue to monitor symptoms if there is any change in severity if there is any increase in ear pressure, pain, drainage, fever, loss of hearing follow-up for further evaluation and urgent care or ER.

## 2024-07-08 DIAGNOSIS — G834 Cauda equina syndrome: Secondary | ICD-10-CM | POA: Diagnosis not present
